# Patient Record
Sex: Male | Born: 2000
Health system: Southern US, Community
[De-identification: ages and names within clinical notes are randomized; demographics above are authoritative.]

## PROBLEM LIST (undated history)

## (undated) DIAGNOSIS — R278 Other lack of coordination: Secondary | ICD-10-CM

## (undated) DIAGNOSIS — F902 Attention-deficit hyperactivity disorder, combined type: Principal | ICD-10-CM

## (undated) HISTORY — DX: Other lack of coordination: R27.8

## (undated) HISTORY — DX: Attention-deficit hyperactivity disorder, combined type: F90.2

---
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
## 2018-03-31 ENCOUNTER — Encounter: Payer: Self-pay | Admitting: Pediatrics

## 2018-03-31 ENCOUNTER — Ambulatory Visit (INDEPENDENT_AMBULATORY_CARE_PROVIDER_SITE_OTHER): Payer: 59 | Admitting: Pediatrics

## 2018-03-31 VITALS — BP 136/84 | HR 82 | Ht 72.0 in | Wt 140.0 lb

## 2018-03-31 DIAGNOSIS — Z719 Counseling, unspecified: Secondary | ICD-10-CM

## 2018-03-31 DIAGNOSIS — Z7189 Other specified counseling: Secondary | ICD-10-CM | POA: Diagnosis not present

## 2018-03-31 DIAGNOSIS — R278 Other lack of coordination: Secondary | ICD-10-CM

## 2018-03-31 DIAGNOSIS — F902 Attention-deficit hyperactivity disorder, combined type: Secondary | ICD-10-CM

## 2018-03-31 DIAGNOSIS — Z79899 Other long term (current) drug therapy: Secondary | ICD-10-CM

## 2018-03-31 MED ORDER — AMPHETAMINE SULFATE 10 MG PO TABS: 10.0000 mg | ORAL_TABLET | ORAL | 0 refills | Status: DC

## 2018-03-31 NOTE — Patient Instructions (Addendum)
DISCUSSION: Patient and family counseled regarding the following coordination of care items:  Continue medication as directed Evekeo 10 mg - two in the morning and one in the afternoon RX for above e-scribed and sent to pharmacy on record  [facility] [place] [place] [address] Phone: [phone] Fax: [phone]  Counseled medication administration, effects, and possible side effects.  ADHD medications discussed to include different medications and pharmacologic properties of each. Recommendation for specific medication to include dose, administration, expected effects, possible side effects and the risk to benefit ratio of medication management.  Advised importance of:  Good sleep hygiene (8- 10 hours per night) Limited screen time (none on school nights, no more than 2 hours on weekends) Regular exercise(outside and active play) Healthy eating (drink water, no sodas/sweet tea, limit portions and no seconds).  Counseling at this visit included the review of old records and/or current chart with the patient and family.   Counseling included the following discussion points presented at every visit to improve understanding and treatment compliance.  Recent health history and today's examination Growth and development with anticipatory guidance provided regarding brain growth, executive function maturation and pubertal development School progress and continued advocay for appropriate accommodations to include maintain Structure, routine, organization, reward, motivation and consequences.  College transition discussed, counseled regarding setting up disability services.

## 2018-03-31 NOTE — Progress Notes (Signed)
[facility]  DEVELOPMENTAL AND PSYCHOLOGICAL CENTER [facility] [address] [place] [address] Dept: [phone] Dept Fax: [phone] Loc: [phone] Loc Fax: [phone]  Medical Follow-up  Patient ID: James BurkeSeth Kotarski, male  DOB: 2001/08/29, 17 y.o.  MRN: [id number]  Date of Evaluation: 03/31/18  PCP: Alena BillsLittle, [name], MD  Accompanied by: Mother and Father Patient Lives with: mother and father  HISTORY/CURRENT STATUS:  Chief Complaint - Polite and cooperative and present for medical follow up for medication management of ADHD, dysgraphia and learning differences. Last follow up Jan 2019 and currently prescribed Evekeo 30 mg (2 in the am and 1 in the Pm).  Senior year, accepted to [employer].    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  No Sleep concerns. No concerns for toileting. Daily stool, no constipation or diarrhea. Void urine no difficulty. No enuresis.   Participate in daily oral hygiene to include brushing and flossing.  Individual Medical History/Review of System Changes? No  Allergies: Amoxicillin  Current Medications:  Evekeo 10 mg 2 in the morning, occasional pm Medication Side Effects: None  Family Medical/Social History Changes?: No  MENTAL HEALTH: Mental Health  Issues:  Denies sadness, loneliness or depression. No self harm or thoughts of self harm or injury. Denies fears, worries and anxieties. Has good peer relations and is not a bully nor is victimized.  Review of Systems  Constitutional: Negative.   HENT: Negative.   Eyes: Negative.   Respiratory: Negative.   Cardiovascular: Negative.   Gastrointestinal: Negative.   Endocrine: Negative.   Genitourinary: Negative.   Musculoskeletal: Negative.   Skin: Negative.   Allergic/Immunologic: Negative.   Neurological: Negative for seizures, speech difficulty and headaches.  Hematological: Negative.   Psychiatric/Behavioral: Negative for behavioral problems, decreased concentration, dysphoric mood and sleep disturbance. The patient is not nervous/anxious and is not hyperactive.   All other systems reviewed and are negative.  PHYSICAL EXAM: Vitals:  Today's Vitals   03/31/18 0802  BP: 136/84  Pulse: 82  Weight: 140 lb (63.5 kg)  Height: 6' (1.829 m)  , 8 %ile (Z= -1.42) based on CDC (Boys, 2-20 Years) BMI-for-age based on BMI available as of 03/31/2018.  Body mass index is 18.99 kg/m.   General Exam: Physical Exam  Constitutional: He is oriented to person, place, and time. Vital signs are normal. He appears well-developed and well-nourished. He is cooperative. No distress.  HENT:  Head: Normocephalic.  Right Ear: Tympanic membrane and ear canal normal.  Left Ear: Tympanic membrane and ear canal normal.  Nose: Nose normal.  Mouth/Throat: Uvula is midline, oropharynx is clear and moist and mucous membranes are normal.  Eyes: Pupils are equal, round, and reactive to light. Conjunctivae, EOM and lids are normal.  Neck: Normal range of motion. Neck supple. No thyromegaly present.  Cardiovascular: Normal rate, regular rhythm and intact distal pulses.  Pulmonary/Chest: Effort normal and breath sounds normal.  Abdominal: Soft. Normal appearance.  Genitourinary:  Genitourinary Comments:  Deferred  Musculoskeletal: Normal range of motion.  Neurological: He is alert and oriented to person, place, and time. He has normal strength and normal reflexes. He displays no tremor. No cranial nerve deficit or sensory deficit. He exhibits normal muscle tone. He displays a negative Romberg sign. He displays no seizure activity. Coordination and gait normal.  Skin: Skin is warm, dry and intact.  Psychiatric: He has a normal mood and affect. His speech is normal and behavior is normal. Judgment and thought content normal. His mood appears not anxious. His affect is not inappropriate. He is not agitated, not aggressive and not hyperactive. Cognition and memory are normal. He does not express impulsivity or inappropriate judgment. He expresses no suicidal ideation. He expresses no suicidal plans. He is attentive.  Vitals reviewed.  Neurological: oriented to place and person  Testing/Developmental Screens: CGI:21/17 Reviewed with patient and parents       DIAGNOSES:    ICD-10-CM   1. ADHD (attention deficit hyperactivity disorder), combined type F90.2   2. Dysgraphia R27.8   3. Medication management Z79.899   4. Patient counseled Z71.9   5. Parenting dynamics counseling Z71.89   6. Counseling and coordination of care Z71.89     RECOMMENDATIONS:  Patient Instructions  DISCUSSION: Patient and family counseled regarding the following coordination of care items:  Continue medication as directed Evekeo 10 mg - two in the morning and one in the afternoon RX for above e-scribed and sent to pharmacy on record  [facility] [place], [place] - [place] [address] [place] [address] Phone: [phone] Fax: [phone]  Counseled medication administration, effects, and possible side effects.  ADHD medications discussed to include different medications and pharmacologic properties of each. Recommendation for specific medication to include dose,  administration, expected effects, possible side effects and the risk to benefit ratio of medication management.  Advised importance of:  Good sleep hygiene (8- 10 hours per night) Limited screen time (none on school nights, no more than 2 hours on weekends) Regular exercise(outside and active play) Healthy eating (drink water, no sodas/sweet tea, limit portions and no seconds).  Counseling at this visit included the review of old records and/or current chart with the patient and family.   Counseling included the following discussion points presented at every visit to improve understanding and treatment compliance.  Recent health history and today's examination Growth and development with anticipatory guidance provided regarding brain growth, executive function maturation and pubertal development School progress and continued advocay for appropriate accommodations to include maintain Structure, routine, organization, reward, motivation and consequences.  College transition discussed, counseled regarding setting up disability services.  Parents verbalized understanding of all topics discussed.   NEXT APPOINTMENT: Return in about 3 months (around 06/30/2018) for Medical Follow up. Medical Decision-making: More than 50% of the appointment was spent counseling and discussing diagnosis and management of symptoms with the patient and family.   Leticia Penna, NP Counseling Time: 40 Total Contact Time: 50

## 2018-07-03 ENCOUNTER — Encounter: Payer: Self-pay | Admitting: Pediatrics

## 2018-07-03 ENCOUNTER — Ambulatory Visit (INDEPENDENT_AMBULATORY_CARE_PROVIDER_SITE_OTHER): Payer: 59 | Admitting: Pediatrics

## 2018-07-03 VITALS — BP 120/69 | HR 64 | Ht 72.0 in | Wt 134.0 lb

## 2018-07-03 DIAGNOSIS — R278 Other lack of coordination: Secondary | ICD-10-CM | POA: Diagnosis not present

## 2018-07-03 DIAGNOSIS — Z719 Counseling, unspecified: Secondary | ICD-10-CM | POA: Diagnosis not present

## 2018-07-03 DIAGNOSIS — Z79899 Other long term (current) drug therapy: Secondary | ICD-10-CM | POA: Diagnosis not present

## 2018-07-03 DIAGNOSIS — Z7189 Other specified counseling: Secondary | ICD-10-CM

## 2018-07-03 DIAGNOSIS — F902 Attention-deficit hyperactivity disorder, combined type: Secondary | ICD-10-CM | POA: Diagnosis not present

## 2018-07-03 MED ORDER — AMPHETAMINE SULFATE 10 MG PO TABS: 10.0000 mg | ORAL_TABLET | ORAL | 0 refills | Status: DC

## 2018-07-03 NOTE — Progress Notes (Signed)
[facility] [address], [place] [place] [address] Dept: [phone] Dept Fax: [phone] Loc: [phone] Loc Fax: [phone]  Medical Follow-up  Patient ID: James Gay, male  DOB: [birth year], 17 y.o.  MRN: [id number]  Date of Evaluation: 07/03/18  PCP: Alena Bills, MD  Accompanied by: Mother and Father Patient Lives with: mother and father  HISTORY/CURRENT STATUS:  Chief Complaint - Polite and cooperative and present for medical follow up for medication management of ADHD, dysgraphia and learning differences. Last follow up April 2019 and currently prescribed Evekeo 10 mg, two in the Am and one in the Pm.  No meds currently, "has been a few weeks".   EDUCATION: School: [employer] - Graduate  [employer] Freshman at [employer] - will live on [address] - random placement, traditional dorm style with two in room and bathroom in hall Orientation completed June, did not meet with DSO Entrepreneur and minor marketing.  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  No Sleep concerns. No concerns for toileting. Daily stool, no constipation or diarrhea. Void urine no difficulty. No enuresis.   Participate in daily oral hygiene to include brushing and flossing.  Individual Medical History/Review of System Changes? No  Allergies: Amoxicillin  Current Medications:  Current Outpatient Medications:  .  Amphetamine Sulfate (EVEKEO) 10 MG TABS, Take 10-30 mg by mouth as directed. Two in the morning daily, 1/2 to one in the  evening as needed. (Patient not taking: Reported on 07/03/2018) Medication Side Effects: None  Family Medical/Social History Changes?: No  MENTAL HEALTH: Mental Health Issues:  Denies sadness, loneliness or depression. No self harm or thoughts of self harm or injury. Denies fears, worries and anxieties. Has good peer relations and is not a bully nor is victimized.  Review of Systems  Constitutional: Negative.   HENT: Negative.   Eyes: Negative.   Respiratory: Negative.   Cardiovascular: Negative.   Gastrointestinal: Negative.   Endocrine: Negative.   Genitourinary: Negative.   Musculoskeletal: Negative.   Skin: Negative.   Allergic/Immunologic: Negative.   Neurological: Negative for seizures, speech difficulty and headaches.  Hematological: Negative.   Psychiatric/Behavioral: Negative for behavioral problems, decreased concentration, dysphoric mood and sleep disturbance. The patient is not nervous/anxious and is not hyperactive.   All other systems reviewed and are negative.  PHYSICAL EXAM: Vitals:  Today's Vitals   07/03/18 0806  BP: 120/69  Pulse: 64  Weight: 134 lb (60.8 kg)  Height: 6' (1.829 m)  , 3 %ile (Z= -1.96) based on CDC (Boys, 2-20 Years) BMI-for-age based on BMI available as of 07/03/2018. Body mass index is 18.17 kg/m.  General Exam: Physical Exam  Constitutional: He is oriented to person, place, and time. Vital signs are normal. He appears well-developed and well-nourished. He is cooperative. No distress.  HENT:  Head: Normocephalic.  Right Ear: Tympanic membrane and ear canal normal.  Left Ear: Tympanic membrane and ear canal normal.  Nose: Nose normal.  Mouth/Throat: Uvula is midline, oropharynx is clear and moist and mucous membranes are normal.  Eyes: Pupils are equal, round, and reactive to light. Conjunctivae, EOM and lids are normal.  Neck:  Normal range of motion. Neck supple. No thyromegaly present.  Cardiovascular: Normal rate, regular rhythm  and intact distal pulses.  Pulmonary/Chest: Effort normal and breath sounds normal.  Abdominal: Soft. Normal appearance.  Genitourinary:  Genitourinary Comments: Deferred  Musculoskeletal: Normal range of motion.  Neurological: He is alert and oriented to person, place, and time. He has normal strength and normal reflexes. He displays no tremor. No cranial nerve deficit or sensory deficit. He exhibits normal muscle tone. He displays a negative Romberg sign. He displays no seizure activity. Coordination and gait normal.  Skin: Skin is warm, dry and intact.  Psychiatric: He has a normal mood and affect. His speech is normal and behavior is normal. Judgment and thought content normal. His mood appears not anxious. His affect is not inappropriate. He is not agitated, not aggressive and not hyperactive. Cognition and memory are normal. He does not express impulsivity or inappropriate judgment. He expresses no suicidal ideation. He expresses no suicidal plans. He is attentive.  Vitals reviewed.   Neurological: oriented to place and person  Testing/Developmental Screens: CGI:5  Reviewed with patient and parents     DIAGNOSES:    ICD-10-CM   1. ADHD (attention deficit hyperactivity disorder), combined type F90.2   2. Dysgraphia R27.8   3. Medication management Z79.899   4. Patient counseled Z71.9   5. Parenting dynamics counseling Z71.89   6. Counseling and coordination of care Z71.89     RECOMMENDATIONS:  Patient Instructions  DISCUSSION: Patient and family counseled regarding the following coordination of care items:  Continue medication as directed Evekeo 10 mg - two every morning, one in the pm  Counseled medication administration, effects, and possible side effects.  ADHD medications discussed to include different medications and pharmacologic properties of each. Recommendation for specific medication to include dose, administration, expected effects, possible side effects and the  risk to benefit ratio of medication management.  Advised importance of:  Good sleep hygiene (8- 10 hours per night) Limited screen time (none on school nights, no more than 2 hours on weekends) Regular exercise(outside and active play) Healthy eating - needs to eat breakfast that is calorie dense (egg snadwich instead of cereal) and approximately 3000 calories per day for age, body size and growth.  Counseling at this visit included the review of old records and/or current chart with the patient and family.   Counseling included the following discussion points presented at every visit to improve understanding and treatment compliance.  Recent health history and today's examination Growth and development with anticipatory guidance provided regarding brain growth, executive function maturation and pubertal development School progress and continued advocay for appropriate accommodations to include maintain Structure, routine, organization, reward, motivation and consequences.  Additionally the patient was counseled to take medication while driving.  Parents verbalized understanding of all topics discussed.   NEXT APPOINTMENT: Return in about 6 months (around 01/03/2019) for Medical Follow up. Medical Decision-making: More than 50% of the appointment was spent counseling and discussing diagnosis and management of symptoms with the patient and family.   Leticia PennaBobi A Sophiya Morello, NP Counseling Time: 40 Total Contact Time: 50

## 2018-07-03 NOTE — Patient Instructions (Addendum)
DISCUSSION: Patient and family counseled regarding the following coordination of care items:  Continue medication as directed Evekeo 10 mg - two every morning, one in the pm  Counseled medication administration, effects, and possible side effects.  ADHD medications discussed to include different medications and pharmacologic properties of each. Recommendation for specific medication to include dose, administration, expected effects, possible side effects and the risk to benefit ratio of medication management.  Advised importance of:  Good sleep hygiene (8- 10 hours per night) Limited screen time (none on school nights, no more than 2 hours on weekends) Regular exercise(outside and active play) Healthy eating - needs to eat breakfast that is calorie dense (egg snadwich instead of cereal) and approximately 3000 calories per day for age, body size and growth.  Counseling at this visit included the review of old records and/or current chart with the patient and family.   Counseling included the following discussion points presented at every visit to improve understanding and treatment compliance.  Recent health history and today's examination Growth and development with anticipatory guidance provided regarding brain growth, executive function maturation and pubertal development School progress and continued advocay for appropriate accommodations to include maintain Structure, routine, organization, reward, motivation and consequences.  Additionally the patient was counseled to take medication while driving.

## 2018-07-30 DIAGNOSIS — E161 Other hypoglycemia: Secondary | ICD-10-CM | POA: Diagnosis not present

## 2018-07-30 DIAGNOSIS — E162 Hypoglycemia, unspecified: Secondary | ICD-10-CM | POA: Diagnosis not present

## 2018-07-30 DIAGNOSIS — I499 Cardiac arrhythmia, unspecified: Secondary | ICD-10-CM | POA: Diagnosis not present

## 2018-09-01 DIAGNOSIS — Z13220 Encounter for screening for lipoid disorders: Secondary | ICD-10-CM | POA: Diagnosis not present

## 2018-09-01 DIAGNOSIS — Z0000 Encounter for general adult medical examination without abnormal findings: Secondary | ICD-10-CM | POA: Diagnosis not present

## 2018-12-05 ENCOUNTER — Ambulatory Visit (INDEPENDENT_AMBULATORY_CARE_PROVIDER_SITE_OTHER): Payer: 59 | Admitting: Pediatrics

## 2018-12-05 ENCOUNTER — Encounter: Payer: Self-pay | Admitting: Pediatrics

## 2018-12-05 VITALS — BP 119/82 | HR 76 | Ht 72.25 in | Wt 132.0 lb

## 2018-12-05 DIAGNOSIS — Z79899 Other long term (current) drug therapy: Secondary | ICD-10-CM

## 2018-12-05 DIAGNOSIS — Z719 Counseling, unspecified: Secondary | ICD-10-CM | POA: Insufficient documentation

## 2018-12-05 DIAGNOSIS — Z7189 Other specified counseling: Secondary | ICD-10-CM | POA: Insufficient documentation

## 2018-12-05 DIAGNOSIS — F902 Attention-deficit hyperactivity disorder, combined type: Secondary | ICD-10-CM

## 2018-12-05 DIAGNOSIS — R278 Other lack of coordination: Secondary | ICD-10-CM

## 2018-12-05 MED ORDER — AMPHETAMINE SULFATE 10 MG PO TABS: 10.0000 mg | ORAL_TABLET | ORAL | 0 refills | Status: DC

## 2018-12-05 NOTE — Patient Instructions (Addendum)
DISCUSSION: Patient and family counseled regarding the following coordination of care items:  Continue medication as directed Evekeo 10 mg 10 mg one or two in am and one in PM RX for above e-scribed and sent to pharmacy on record  [facility] [place] [place] [address] Phone: [phone] Fax: [phone]  Counseled medication administration, effects, and possible side effects.  ADHD medications discussed to include different medications and pharmacologic properties of each. Recommendation for specific medication to include dose, administration, expected effects, possible side effects and the risk to benefit ratio of medication management.  Advised importance of:  Good sleep hygiene (8- 10 hours per night) Limited screen time (none on school nights, no more than 2 hours on weekends) Regular exercise(outside and active play) Healthy eating (drink water, no sodas/sweet tea, limit portions and no seconds).  Counseling at this visit included the review of old records and/or current chart with the patient and family.   Counseling included the following discussion points presented at every visit to improve understanding and treatment compliance.  Recent health history and today's examination Growth and development with anticipatory guidance provided regarding brain growth, executive function maturation and pubertal development School progress and continued advocay for appropriate accommodations to include maintain Structure, routine, organization, reward, motivation and consequences.  Additionally the patient was counseled to take medication while driving.

## 2018-12-05 NOTE — Progress Notes (Signed)
Patient ID: James Gay, male   DOB: Jun 16, 2001, 17 y.o.   MRN: [id number]  Medical Follow-up  Patient ID: James Gay  DOB: [birth year]  MRN: [id number]  DATE:12/05/18 James Gay, [name], MD  Accompanied by: Self Patient Lives with: mother and father   [employer] dorm with roommate James Gay, two beds and bathroom in hallway with common area  HISTORY/CURRENT STATUS: Chief Complaint - Polite and cooperative and present for medical follow up for medication management of ADHD, dysgraphia and learning differences. Last follow up July 2019 and currently prescribed Evekeo 10 mg, as needed, mostly one in the AM for classes.   Reports that he "forgets a lot" and that it does help for focus when he remembers  EDUCATION: School: [employer]  Year/Grade: Fresh ENG 101, STH - business, CST communication, personal devo 15 hours Passed all A/B grades  Hangs with friends, cool with roommate Hispanic culture  Planning on going back for spring  MEDICAL HISTORY: Appetite: WNL  Sleep: Bedtime: variable 2300 usual Awakens: variable - some early 08 and 09 classes Sleep Concerns: Initiation/Maintenance/Other: Asleep easily, sleeps through the night, feels well-rested.  No Sleep concerns. No concerns for toileting. Daily stool, no constipation or diarrhea. Void urine no difficulty. No enuresis.   Participate in daily oral hygiene to include brushing and flossing.  Individual Medical History/Review of System Changes? No  Allergies:  Allergies  Allergen Reactions  . Amoxicillin Hives   Current Medications:  Evekeo 10 mg - reports some occasional forgetting. Medication Side Effects: None  Family Medical/Social History Changes?: No  MENTAL HEALTH: Mental Health Issues:  Denies sadness, loneliness or depression. No self harm or thoughts of self harm or injury. Denies fears, worries and anxieties. Has good peer relations and is not a bully nor is victimized.  ROS: Review of Systems  Constitutional: Negative.     HENT: Negative.   Eyes: Negative.   Respiratory: Negative.   Cardiovascular: Negative.   Gastrointestinal: Negative.   Endocrine: Negative.   Genitourinary: Negative.   Musculoskeletal: Negative.   Skin: Negative.   Allergic/Immunologic: Negative.   Neurological: Negative for seizures, speech difficulty and headaches.  Hematological: Negative.   Psychiatric/Behavioral: Negative for behavioral problems, decreased concentration, dysphoric mood and sleep disturbance. The patient is not nervous/anxious and is not hyperactive.   All other systems reviewed and are negative.   PHYSICAL EXAM: Vitals:   12/05/18 1015  BP: 119/82  Pulse: 76  Weight: 132 lb (59.9 kg)  Height: 6' 0.25" (1.835 m)   Body mass index is 17.78 kg/m.  General Exam: Physical Exam Vitals signs reviewed.  Constitutional:      General: He is not in acute distress.    Appearance: Normal appearance. He is well-developed.  HENT:     Head: Normocephalic.     Right Ear: Tympanic membrane and ear canal normal.     Left Ear: Tympanic membrane and ear canal normal.     Nose: Nose normal.     Mouth/Throat:     Pharynx: Uvula midline.  Eyes:     General: Lids are normal.     Conjunctiva/sclera: Conjunctivae normal.     Pupils: Pupils are equal, round, and reactive to light.  Neck:     Musculoskeletal: Normal range of motion and neck supple.     Thyroid: No thyromegaly.  Cardiovascular:     Rate and Rhythm: Normal rate and regular rhythm.  Pulmonary:     Effort: Pulmonary effort is normal.     Breath sounds: Normal breath  sounds.  Abdominal:     Palpations: Abdomen is soft.  Genitourinary:    Comments: Deferred Musculoskeletal: Normal range of motion.  Skin:    General: Skin is warm and dry.  Neurological:     Mental Status: He is alert and oriented to person, place, and time.     Cranial Nerves: No cranial nerve deficit.     Sensory: No sensory deficit.     Motor: No tremor, abnormal muscle tone or  seizure activity.     Coordination: Coordination normal.     Gait: Gait normal.     Deep Tendon Reflexes: Reflexes are normal and symmetric.  Psychiatric:        Attention and Perception: He is attentive.        Mood and Affect: Mood is not anxious. Affect is not inappropriate.        Speech: Speech normal.        Behavior: Behavior normal. Behavior is not agitated, aggressive or hyperactive. Behavior is cooperative.        Thought Content: Thought content normal. Thought content does not include suicidal ideation. Thought content does not include suicidal plan.        Judgment: Judgment normal. Judgment is not impulsive or inappropriate.    Neurological: oriented to time, place, and person  Testing/Developmental Screens: CGI:19/16 Reviewed with patient       DIAGNOSES:    ICD-10-CM   1. ADHD (attention deficit hyperactivity disorder), combined type F90.2   2. Dysgraphia R27.8   3. Patient counseled Z71.9   4. Parenting dynamics counseling Z71.89   5. Medication management Z79.899   6. Counseling and coordination of care Z71.89     RECOMMENDATIONS:  Patient Instructions  DISCUSSION: Patient and family counseled regarding the following coordination of care items:  Continue medication as directed Evekeo 10 mg 10 mg one or two in am and one in PM RX for above e-scribed and sent to pharmacy on record  [facility] - [place], [place] [place] [address] Phone: [phone] Fax: [phone]  Counseled medication administration, effects, and possible side effects.  ADHD medications discussed to include different medications and pharmacologic properties of each. Recommendation for specific medication to include dose, administration, expected effects, possible side effects and the risk to benefit ratio of medication management.  Advised importance of:  Good sleep hygiene (8- 10 hours per night) Limited screen time  (none on school nights, no more than 2 hours on weekends) Regular exercise(outside and active play) Healthy eating (drink water, no sodas/sweet tea, limit portions and no seconds).  Counseling at this visit included the review of old records and/or current chart with the patient and family.   Counseling included the following discussion points presented at every visit to improve understanding and treatment compliance.  Recent health history and today's examination Growth and development with anticipatory guidance provided regarding brain growth, executive function maturation and pubertal development School progress and continued advocay for appropriate accommodations to include maintain Structure, routine, organization, reward, motivation and consequences.  Additionally the patient was counseled to take medication while driving.     Patient verbalized understanding of all topics discussed.  NEXT APPOINTMENT: Return in about 6 months (around 06/06/2019) for Medical Follow up.  Medical Decision-making: More than 50% of the appointment was spent counseling and discussing diagnosis and management of symptoms with the patient and family.  Counseling Time: 40 minutes Total Contact Time: 50 minutes

## 2019-05-21 ENCOUNTER — Encounter: Payer: Self-pay | Admitting: Pediatrics

## 2019-05-21 ENCOUNTER — Other Ambulatory Visit: Payer: Self-pay

## 2019-05-21 ENCOUNTER — Ambulatory Visit (INDEPENDENT_AMBULATORY_CARE_PROVIDER_SITE_OTHER): Payer: 59 | Admitting: Pediatrics

## 2019-05-21 DIAGNOSIS — Z79899 Other long term (current) drug therapy: Secondary | ICD-10-CM

## 2019-05-21 DIAGNOSIS — F902 Attention-deficit hyperactivity disorder, combined type: Secondary | ICD-10-CM | POA: Diagnosis not present

## 2019-05-21 DIAGNOSIS — R278 Other lack of coordination: Secondary | ICD-10-CM | POA: Diagnosis not present

## 2019-05-21 DIAGNOSIS — Z0282 Encounter for adoption services: Secondary | ICD-10-CM | POA: Insufficient documentation

## 2019-05-21 DIAGNOSIS — Z7189 Other specified counseling: Secondary | ICD-10-CM

## 2019-05-21 DIAGNOSIS — Z719 Counseling, unspecified: Secondary | ICD-10-CM | POA: Diagnosis not present

## 2019-05-21 MED ORDER — AMPHETAMINE SULFATE 10 MG PO TABS: 10.0000 mg | ORAL_TABLET | ORAL | 0 refills | Status: DC

## 2019-05-21 NOTE — Progress Notes (Signed)
[facility] [address], [place]. [address] [place] [address] Dept: [phone] Dept Fax: [phone]  Medication Check by FaceTime due to COVID-19  Patient ID:  James Gay  male DOB: 04-27-2001   18 y.o.   MRN: [id number]   DATE:05/21/19  PCP: Alena Bills, MD  Interviewed: Angela Burke and Mother  Name: James Gay Location: Their Home Provider location: [facility] Office  Virtual Visit via Video Note Connected with James Gay on 05/21/19 at  2:00 PM EDT by video enabled telemedicine application and verified that I am speaking with the correct person using two identifiers.    I discussed the limitations, risks, security and privacy concerns of performing an evaluation and management service by telephone and the availability of in person appointments. I also discussed with the parents that there may be a patient responsible charge related to this service. The parents expressed understanding and agreed to proceed.  HISTORY OF PRESENT ILLNESS/CURRENT STATUS: James Gay is being followed for medication management for ADHD, dysgraphia and learning disability.   Last visit on 12/06/2019  James Gay currently prescribed Evekeo 10 mg  - 30 mg not taking now. Eating well (eating breakfast, lunch and dinner).   Sleeping: bedtime 2430 pm and wakes at 1000  sleeping through the night.   EDUCATION: School: [employer]   Year/Grade:  Freshman Was back for two or three weeks then home.  James Gay is currently out of school for social distancing due to COVID-19.  On-line was harder, especially in subjects that were not strengths, due to not able to do office hours Math D - college algebra, word problems History of Capitalism B - Art intro C- Econ - F ISM - computing - F  Took advantage of academic relief - allows unsat or withdraw no penalty, anything below C- Withdrew college algebra, unsat in [place] and [place]. Preserves GPA, no credit for  the unsats and withdrawals.  Gained 6 credits Will talk with DSO, for changing majors from business and get the math out of the way.  Finished about one month ago (3 weeks) First semester did well, emancipated from parents  Summer session - all on line, may do wants in class work. Planning on lining up employment - [employer] worked 18 months, did stop during school.  Activities/ Exercise: daily, working  Screen time: (phone, tablet, TV, computer): non-essential   MEDICAL HISTORY: Individual Medical History/ Review of Systems: Changes? :No  Family Medical/ Social History: Changes? No   Patient Lives with: mother and father  Current Medications:  Evekeo 10 mg - not taking daily  Medication Side Effects: None  MENTAL HEALTH: Mental Health Issues:    Denies sadness, loneliness or depression. No self harm or thoughts of self harm or injury. Denies fears, worries and anxieties. Has good peer relations and is not a bully nor is victimized.  DIAGNOSES:    ICD-10-CM   1. ADHD (attention deficit hyperactivity disorder), combined type F90.2   2. Dysgraphia R27.8   3. Medication management Z79.899   4. Patient counseled Z71.9   5. Parenting dynamics counseling Z71.89   6. Counseling and coordination of care Z71.89      RECOMMENDATIONS:  Patient Instructions  DISCUSSION: Counseled regarding the following coordination of care items:  Continue medication as directed Evekeo 10 - 30 mg daily, as directed RX for above e-scribed and sent to pharmacy on record  [facility] - [place], [place] - [address]  [address] [place] [address] Phone: [phone] Fax: [phone]   Daily medication for driving  Counseled medication administration, effects, and possible side effects.  ADHD medications discussed to include different medications and pharmacologic properties of each. Recommendation for specific medication to include dose,  administration, expected effects, possible side effects and the risk to benefit ratio of medication management.  Advised importance of:  Good sleep hygiene (8- 10 hours per night) No later than 2400, up by 0900  Limited screen time (none on school nights, no more than 2 hours on weekends)  Regular exercise(outside and active play) Be physical, walk, run, bike, swim  Healthy eating (drink water, no sodas/sweet tea)  Access DSO for major selection and more support for academic difficulties with math, and on-line learning.      Discussed continued need for routine, structure, motivation, reward and positive reinforcement  Encouraged recommended limitations on TV, tablets, phones, video games and computers for non-educational activities.  Encouraged physical activity and outdoor play, maintaining social distancing.  Discussed how to talk to anxious children about coronavirus.   Referred to ADDitudemag.com for resources about engaging children who are at home in home and online study.    NEXT APPOINTMENT:  Return in about 6 months (around 11/20/2019) for Medication Check. Please call the office for a sooner appointment if problems arise.  Medical Decision-making: More than 50% of the appointment was spent counseling and discussing diagnosis and management of symptoms with the patient and family.  I discussed the assessment and treatment plan with the parent. The parent was provided an opportunity to ask questions and all were answered. The parent agreed with the plan and demonstrated an understanding of the instructions.   The parent was advised to call back or seek an in-person evaluation if the symptoms worsen or if the condition fails to improve as anticipated.  I provided 25 minutes of non-face-to-face time during this encounter.   Completed record review for 0 minutes prior to the virtual video visit.   James PennaBobi A Ruvim Risko, NP  Counseling Time: 25 minutes   Total Contact Time: 25  minutes

## 2019-05-21 NOTE — Patient Instructions (Signed)
DISCUSSION: Counseled regarding the following coordination of care items:  Continue medication as directed Evekeo 10 - 30 mg daily, as directed RX for above e-scribed and sent to pharmacy on record  [facility] - [place], [place] - [place] [address] [place] [address] Phone: [phone] Fax: [phone]   Daily medication for driving  Counseled medication administration, effects, and possible side effects.  ADHD medications discussed to include different medications and pharmacologic properties of each. Recommendation for specific medication to include dose, administration, expected effects, possible side effects and the risk to benefit ratio of medication management.  Advised importance of:  Good sleep hygiene (8- 10 hours per night) No later than 2400, up by 0900  Limited screen time (none on school nights, no more than 2 hours on weekends)  Regular exercise(outside and active play) Be physical, walk, run, bike, swim  Healthy eating (drink water, no sodas/sweet tea)  Access DSO for major selection and more support for academic difficulties with math, and on-line learning.

## 2019-11-30 ENCOUNTER — Other Ambulatory Visit: Payer: Self-pay

## 2019-11-30 ENCOUNTER — Encounter: Payer: Self-pay | Admitting: Pediatrics

## 2019-11-30 ENCOUNTER — Ambulatory Visit (INDEPENDENT_AMBULATORY_CARE_PROVIDER_SITE_OTHER): Payer: 59 | Admitting: Pediatrics

## 2019-11-30 DIAGNOSIS — Z79899 Other long term (current) drug therapy: Secondary | ICD-10-CM | POA: Diagnosis not present

## 2019-11-30 DIAGNOSIS — Z789 Other specified health status: Secondary | ICD-10-CM

## 2019-11-30 DIAGNOSIS — Z0282 Encounter for adoption services: Secondary | ICD-10-CM | POA: Diagnosis not present

## 2019-11-30 DIAGNOSIS — Z719 Counseling, unspecified: Secondary | ICD-10-CM

## 2019-11-30 DIAGNOSIS — R278 Other lack of coordination: Secondary | ICD-10-CM

## 2019-11-30 DIAGNOSIS — F902 Attention-deficit hyperactivity disorder, combined type: Secondary | ICD-10-CM

## 2019-11-30 DIAGNOSIS — Z7189 Other specified counseling: Secondary | ICD-10-CM

## 2019-11-30 MED ORDER — AMPHETAMINE SULFATE 10 MG PO TABS: 10.0000 mg | ORAL_TABLET | Freq: Two times a day (BID) | ORAL | 0 refills | Status: AC

## 2019-11-30 NOTE — Patient Instructions (Signed)
DISCUSSION: Counseled regarding the following coordination of care items:  Continue medication as directed Evekeo 10 mg Twice daily RX for above e-scribed and sent to pharmacy on record  [place], [place] [address] Phone: [phone] Fax: [phone]  Counseled medication administration, effects, and possible side effects.  ADHD medications discussed to include different medications and pharmacologic properties of each. Recommendation for specific medication to include dose, administration, expected effects, possible side effects and the risk to benefit ratio of medication management.  Advised importance of:  Good sleep hygiene (8- 10 hours per night)  Limited screen time (none on school nights, no more than 2 hours on weekends)  Regular exercise(outside and active play)  Healthy eating (drink water, no sodas/sweet tea)  Counseling at this visit included the review of old records and/or current chart.   Counseling included the following discussion points presented at every visit to improve understanding and treatment compliance.  Recent health history and today's examination Growth and development with anticipatory guidance provided regarding brain growth, executive function maturation and pre or pubertal development. School progress and continued advocay for appropriate accommodations to include maintain Structure, routine, organization, reward, motivation and consequences.  Additionally the patient was counseled to take medication while driving.

## 2019-11-30 NOTE — Progress Notes (Signed)
[facility] [place]. [address] [place] [address] Dept: [phone] Dept Fax: [phone]  Medication Check by FaceTime due to COVID-19  Patient ID:  James Gay  male DOB: 2001/01/04   18 y.o.   MRN: [id number]   DATE:11/30/19  PCP: Johny Drilling, MD  Interviewed: Isaiah Blakes  Location: His home (lives with parents) Provider location: [facility] office  Virtual Visit via Video Note Connected with Erving Sassano on 11/30/19 at  2:00 PM EST by video enabled telemedicine application and verified that I am speaking with the correct person using two identifiers.    I discussed the limitations, risks, security and privacy concerns of performing an evaluation and management service by telephone and the availability of in person appointments. I also discussed with the parent/patient that there may be a patient responsible charge related to this service. The parent/patient expressed understanding and agreed to proceed.  HISTORY OF PRESENT ILLNESS/CURRENT STATUS: Dimitriy Carreras is being followed for medication management for ADHD, dysgraphia and learning differences.   Last visit on 05/21/2019  [place] currently prescribed Evekeo 10 mg - one in the morning    Behaviors: feels medication is working for the day, no changes.  Eating well (eating breakfast, lunch and dinner).   Sleeping: bedtime 2300 - 2400 pm awake by 0700 - 0900  Sleeping through the night.   EDUCATION: School: [employer]  Year/Grade: Freshman  Currently on break - classes ended 12/7 Took four classes due to major changing World geo - A [place] - B Earth - A Contemporary culture - F (sociology)  Will do Spring semester and will take: spanish  Changed major to communication - may have one more math, but not hard. Was on campus in dorm, but all virtual most classes. Has one roommate in a four person suite.  Not currently employed.  Did try to work with his full  schedule and was at [employer].  Was not able to keep up.  Activities/ Exercise: daily  Was doing longboarding Friends on line  Screen time: (phone, tablet, TV, computer): non-essential, not excessive Has friends on line.  MEDICAL HISTORY: Individual Medical History/ Review of Systems: Changes? :No  Family Medical/ Social History: Changes? No   Patient Lives with: mother and father  No covid issues from roommate Father had surgery for infection, still in boot  Current Medications:  Evekeo 10 mg - taking one in the morning, most days.  Medication Side Effects: None  MENTAL HEALTH: Mental Health Issues:    Denies sadness, loneliness or depression. No self harm or thoughts of self harm or injury. Denies fears, worries and anxieties. Has good peer relations and is not a bully nor is victimized. Coping doing well  DIAGNOSES:    ICD-10-CM   1. ADHD (attention deficit hyperactivity disorder), combined type  F90.2   2. Dysgraphia  R27.8   3. Adopted person  Z02.82   4. Medication management  Z79.899   5. Patient counseled  Z71.9   6. Counseling and coordination of care  Z71.89     RECOMMENDATIONS:  Patient Instructions  DISCUSSION: Counseled regarding the following coordination of care items:  Continue medication as directed Evekeo 10 mg Twice daily RX for above e-scribed and sent to pharmacy on record  [place], [place] [address] Phone: [phone] Fax: [phone]  Counseled medication administration, effects, and possible side effects.  ADHD medications discussed to include different medications and pharmacologic properties of each. Recommendation for specific medication to include dose, administration, expected effects, possible side effects and the risk to benefit ratio of medication management.  Advised importance of:  Good sleep hygiene (8- 10 hours per night)  Limited  screen time (none on school nights, no more than 2 hours on weekends)  Regular exercise(outside and active play)  Healthy eating (drink water, no sodas/sweet tea)  Counseling at this visit included the review of old records and/or current chart.   Counseling included the following discussion points presented at every visit to improve understanding and treatment compliance.  Recent health history and today's examination Growth and development with anticipatory guidance provided regarding brain growth, executive function maturation and pre or pubertal development. School progress and continued advocay for appropriate accommodations to include maintain Structure, routine, organization, reward, motivation and consequences.  Additionally the patient was counseled to take medication while driving.        Discussed continued need for routine, structure, motivation, reward and positive reinforcement  Encouraged recommended limitations on TV, tablets, phones, video games and computers for non-educational activities.  Encouraged physical activity and outdoor play, maintaining social distancing.  Discussed how to talk to anxious children about coronavirus.   Referred to ADDitudemag.com for resources about engaging children who are at home in home and online study.    NEXT APPOINTMENT:  Return in about 6 months (around 05/30/2020) for Medication Check. Please call the office for a sooner appointment if problems arise.  Medical Decision-making: More than 50% of the appointment was spent counseling and discussing diagnosis and management of symptoms with the parent/patient.  I discussed the assessment and treatment plan with the parent. The parent/patient was provided an opportunity to ask questions and all were answered. The parent/patient agreed with the plan and demonstrated an understanding of the instructions.   The parent/patient was advised to call back or seek an in-person evaluation  if the symptoms worsen or if the condition fails to improve as anticipated.  I provided 25 minutes of non-face-to-face time during this encounter.   Completed record review for 0 minutes prior to the virtual video visit.   Leticia Penna, NP  Counseling Time: 25 minutes   Total Contact Time: 25 minutes

## 2020-09-26 ENCOUNTER — Institutional Professional Consult (permissible substitution): Payer: 59 | Admitting: Pediatrics

## 2020-10-30 ENCOUNTER — Emergency Department
Admission: EM | Admit: 2020-10-30 | Discharge: 2020-10-30 | Disposition: A | Discharge status: Home / Self Care | Payer: 59 | Attending: Emergency Medicine | Admitting: Emergency Medicine

## 2020-10-30 ENCOUNTER — Other Ambulatory Visit: Payer: Self-pay

## 2020-10-30 ENCOUNTER — Emergency Department: Payer: 59

## 2020-10-30 DIAGNOSIS — Z87891 Personal history of nicotine dependence: Secondary | ICD-10-CM | POA: Diagnosis not present

## 2020-10-30 DIAGNOSIS — F10920 Alcohol use, unspecified with intoxication, uncomplicated: Secondary | ICD-10-CM

## 2020-10-30 DIAGNOSIS — F10129 Alcohol abuse with intoxication, unspecified: Secondary | ICD-10-CM | POA: Insufficient documentation

## 2020-10-30 DIAGNOSIS — E876 Hypokalemia: Secondary | ICD-10-CM | POA: Insufficient documentation

## 2020-10-30 DIAGNOSIS — R0602 Shortness of breath: Secondary | ICD-10-CM

## 2020-10-30 LAB — BASIC METABOLIC PANEL
Anion gap: 14 (ref 5–15)
BUN: 13 mg/dL (ref 6–20)
CO2: 21 mmol/L — ABNORMAL LOW (ref 22–32)
Calcium: 8.9 mg/dL (ref 8.9–10.3)
Chloride: 102 mmol/L (ref 98–111)
Creatinine, Ser: 0.86 mg/dL (ref 0.61–1.24)
GFR, Estimated: >60 mL/min (ref >60)
Glucose, Bld: 102 mg/dL — ABNORMAL HIGH (ref 70–99)
Potassium: 2.7 mmol/L — CL (ref 3.5–5.1)
Sodium: 137 mmol/L (ref 135–145)

## 2020-10-30 LAB — CBC WITH DIFFERENTIAL/PLATELET
Abs Immature Granulocytes: 0.02 10*3/uL (ref 0.00–0.07)
Basophils Absolute: 0.1 10*3/uL (ref 0.0–0.1)
Basophils Relative: 1 %
Eosinophils Absolute: 0.5 10*3/uL (ref 0.0–0.5)
Eosinophils Relative: 6 %
HCT: 43.2 % (ref 39.0–52.0)
Hemoglobin: 14.8 g/dL (ref 13.0–17.0)
Immature Granulocytes: 0 %
Lymphocytes Relative: 37 %
Lymphs Abs: 3.5 10*3/uL (ref 0.7–4.0)
MCH: 29.5 pg (ref 26.0–34.0)
MCHC: 34.3 g/dL (ref 30.0–36.0)
MCV: 86.2 fL (ref 80.0–100.0)
Monocytes Absolute: 0.7 10*3/uL (ref 0.1–1.0)
Monocytes Relative: 7 %
Neutro Abs: 4.7 10*3/uL (ref 1.7–7.7)
Neutrophils Relative %: 49 %
Platelets: 406 10*3/uL — ABNORMAL HIGH (ref 150–400)
RBC: 5.01 MIL/uL (ref 4.22–5.81)
RDW: 12.4 % (ref 11.5–15.5)
WBC: 9.5 10*3/uL (ref 4.0–10.5)
nRBC: 0 % (ref 0.0–0.2)

## 2020-10-30 LAB — URINE DRUG SCREEN, QUALITATIVE (ARMC ONLY)
Amphetamines, Ur Screen: NOT DETECTED
Barbiturates, Ur Screen: NOT DETECTED
Benzodiazepine, Ur Scrn: NOT DETECTED
Cannabinoid 50 Ng, Ur ~~LOC~~: POSITIVE — AB
Cocaine Metabolite,Ur ~~LOC~~: NOT DETECTED
MDMA (Ecstasy)Ur Screen: NOT DETECTED
Methadone Scn, Ur: NOT DETECTED
Opiate, Ur Screen: NOT DETECTED
Phencyclidine (PCP) Ur S: NOT DETECTED
Tricyclic, Ur Screen: NOT DETECTED

## 2020-10-30 LAB — MAGNESIUM: Magnesium: 2.2 mg/dL (ref 1.7–2.4)

## 2020-10-30 LAB — ETHANOL: Alcohol, Ethyl (B): 118 mg/dL — ABNORMAL HIGH (ref <10)

## 2020-10-30 MED ORDER — POTASSIUM CHLORIDE CRYS ER 20 MEQ PO TBCR
40.0000 meq | EXTENDED_RELEASE_TABLET | Freq: Once | ORAL | Status: AC
  Administered 2020-10-30: 40 meq via ORAL
  Filled 2020-10-30: qty 2

## 2020-10-30 MED ORDER — POTASSIUM CHLORIDE ER 10 MEQ PO TBCR: 10.0000 meq | EXTENDED_RELEASE_TABLET | Freq: Every day | ORAL | 0 refills | Status: AC

## 2020-10-30 MED ORDER — LORAZEPAM 2 MG/ML IJ SOLN
1.0000 mg | Freq: Once | INTRAMUSCULAR | Status: AC
  Administered 2020-10-30: 1 mg via INTRAVENOUS
  Filled 2020-10-30: qty 1

## 2020-10-30 MED ORDER — DROPERIDOL 2.5 MG/ML IJ SOLN: 2.5000 mg | Freq: Once | INTRAMUSCULAR | Status: DC

## 2020-10-30 NOTE — ED Notes (Signed)
[facility] called to pick up pt for safe discharge. Pt A&O x4 with steady gait. Pt provided with discharge instructions and lead to WR to await ride.

## 2020-10-30 NOTE — ED Notes (Signed)
Pt in hallway using an electric cigarette. Pt educated on the tobacco use policy and advised to put device up until pt discharged.

## 2020-10-30 NOTE — ED Triage Notes (Signed)
Pt arrives to ED from [facility] via [facility] with c/c of altered mental status and alcohol intoxication. EMS reports that patient had consumed multiple shots of liquor. Pt vomited multiple times during transport to ED. Upon arrival, pt alert and oriented to self. Pt cussing and argumentative. Pt arrives with 18g in right AC and 500 NS. Dr Larinda Buttery at pt bedside.

## 2020-10-30 NOTE — ED Notes (Signed)
Pts mother called and asked for update. Pt yelled "who fucking called my parents, you guys are assholes". This nurse informed the patient that no one at this hospital had called his parents, they called here. He stated that "under no curcumstances are you to fucking tell them anything". This nurse informed the patients mother that he was here and safe but I was not able to give any further updates. Pts mother stated that she understood and hung up the phone. This nurse has repeatedly asked the patient not to curse.

## 2020-10-30 NOTE — ED Notes (Signed)
Lab called this RN about K of 2.7, RN made Tom, RN aware.

## 2020-10-30 NOTE — ED Notes (Signed)
Pt up and ambulatory with steady gait to throw trash away.

## 2020-10-30 NOTE — ED Notes (Signed)
Pt assisted to bathroom by Selena Batten, NT.

## 2020-10-30 NOTE — ED Provider Notes (Signed)
[facility] Emergency Department Provider Note   ____________________________________________   First MD Initiated Contact with Patient 10/30/20 0014     (approximate)  I have reviewed the triage vital signs and the nursing notes.   HISTORY  Chief Complaint Altered Mental Status and Alcohol Intoxication    HPI James Gay is a 19 y.o. male with no significant past medical history who presents to the ED for alcohol intoxication.  History is limited due to patient's level of intoxication.  EMS reports that his friends at [facility] called 911 due to patient's level of intoxication and decreased responsiveness.  He vomited multiple times with EMS and was given 4 mg of Zofran IV as well as IV fluids.  On arrival, patient admits to drinking a large amount of alcohol, but denies taking anything else.  He complains of some difficulty breathing, denies other complaints.        Past Medical History:  Diagnosis Date  . ADHD (attention deficit hyperactivity disorder), combined type 03/06/2016  . Dysgraphia 03/06/2016    Patient Active Problem List   Diagnosis Date Noted  . Adopted person 05/21/2019  . ADHD (attention deficit hyperactivity disorder), combined type 03/06/2016  . Dysgraphia 03/06/2016    Past Surgical History:  Procedure Laterality Date  . MOUTH SURGERY  June 08, 2015   Wisdom teeth extracted  . MOUTH SURGERY  2005   Tooth extraction under anesthesia at 19 years of age    Prior to Admission medications   Medication Sig Start Date End Date Taking? Authorizing Provider  Amphetamine Sulfate (EVEKEO) 10 MG TABS Take 10 mg by mouth 2 (two) times daily. 11/30/19   Crump, Bobi A, NP  potassium chloride (KLOR-CON) 10 MEQ tablet Take 1 tablet (10 mEq total) by mouth daily for 5 days. 10/30/20 11/04/20  Chesley Noon, MD    Allergies Amoxicillin  Family History  Adopted: Yes  Family history unknown: Yes    Social History Social History    Tobacco Use  . Smoking status: Former Smoker    Types: E-cigarettes  . Smokeless tobacco: Never Used  . Tobacco comment: last vape end of June 2019  Substance Use Topics  . Alcohol use: No    Alcohol/week: 0.0 standard drinks  . Drug use: Not Currently    Types: Marijuana    Comment: summer issues 2018, two months    Review of Systems  Constitutional: No fever/chills.  Positive for intoxication. Eyes: No visual changes. ENT: No sore throat. Cardiovascular: Denies chest pain. Respiratory: Positive for shortness of breath. Gastrointestinal: No abdominal pain.  No nausea, no vomiting.  No diarrhea.  No constipation. Genitourinary: Negative for dysuria. Musculoskeletal: Negative for back pain. Skin: Negative for rash. Neurological: Negative for headaches, focal weakness or numbness.  ____________________________________________   PHYSICAL EXAM:  VITAL SIGNS: ED Triage Vitals  Enc Vitals Group     BP      Pulse      Resp      Temp      Temp src      SpO2      Weight      Height      Head Circumference      Peak Flow      Pain Score      Pain Loc      Pain Edu?      Excl. in GC?     Constitutional: Awake and alert, intoxicated appearing. Eyes: Conjunctivae are normal. Head: Atraumatic. Nose: No  congestion/rhinnorhea. Mouth/Throat: Mucous membranes are moist. Neck: Normal ROM Cardiovascular: Normal rate, regular rhythm. Grossly normal heart sounds. Respiratory: Normal respiratory effort.  No retractions. Lungs CTAB. Gastrointestinal: Soft and nontender. No distention. Genitourinary: deferred Musculoskeletal: No lower extremity tenderness nor edema. Neurologic:  Normal speech and language. No gross focal neurologic deficits are appreciated. Skin:  Skin is warm, dry and intact. No rash noted. Psychiatric: Mood and affect are normal. Speech and behavior are normal.  ____________________________________________   ED ECG REPORT I, Chesley Noon, the  attending physician, personally viewed and interpreted this ECG.   Date: 10/30/2020  EKG Time: 00:41  Rate: 77  Rhythm: normal sinus rhythm  Axis: Normal  Intervals:Prolonged QT  ST&T Change: None   LABS (all labs ordered are listed, but only abnormal results are displayed)  Labs Reviewed  CBC WITH DIFFERENTIAL/PLATELET - Abnormal; Notable for the following components:      Result Value   Platelets 406 (*)    All other components within normal limits  BASIC METABOLIC PANEL - Abnormal; Notable for the following components:   Potassium 2.7 (*)    CO2 21 (*)    Glucose, Bld 102 (*)    All other components within normal limits  ETHANOL - Abnormal; Notable for the following components:   Alcohol, Ethyl (B) 118 (*)    All other components within normal limits  URINE DRUG SCREEN, QUALITATIVE ([facility] ONLY) - Abnormal; Notable for the following components:   Cannabinoid 50 Ng, Ur [place] POSITIVE (*)    All other components within normal limits  MAGNESIUM     PROCEDURES  Procedure(s) performed (including Critical Care):  Procedures   ____________________________________________   INITIAL IMPRESSION / ASSESSMENT AND PLAN / ED COURSE       19 year old male with no significant past medical history presents to the ED after friends called EMS due to concern for alcohol intoxication.  Patient appears heavily intoxicated on arrival and admits to consuming large amount of alcohol, but denies drug use.  He does report some difficulty breathing, although is speaking without difficulty, is not in any obvious respiratory distress, and is maintaining O2 sats on room air.  Given his vomiting, we will check chest x-ray for evidence of aspiration or other acute process.  Chest x-ray reviewed by me, no infiltrate, edema, or effusion noted.  EKG shows no evidence of arrhythmia or ischemia, does show mildly prolonged QT.  Labs remarkable for hypokalemia, which is likely contributing to his prolonged  QT.  He was given oral potassium supplementation, is now more awake and alert and no longer complaining of difficulty breathing.  I doubt significant aspiration at this time.  Patient now clinically sober, tolerating p.o. and ambulating with a steady gait.  He is appropriate for discharge home with his friends providing safe ride.  He was prescribed a short course of potassium supplementation and counseled to have his potassium rechecked at his PCPs office.  Patient agrees with plan.      ____________________________________________   FINAL CLINICAL IMPRESSION(S) / ED DIAGNOSES  Final diagnoses:  Alcoholic intoxication without complication (HCC)  Hypokalemia     ED Discharge Orders         Ordered    potassium chloride (KLOR-CON) 10 MEQ tablet  Daily        10/30/20 1191           Note:  This document was prepared using Dragon voice recognition software and may include unintentional dictation errors.   Chesley Noon, MD  10/30/20 0621  

## 2020-10-30 NOTE — ED Notes (Signed)
Pt provided with meal tray and PO fluids.

## 2021-05-30 IMAGING — CR DG CHEST 1V
1 series · 1 of 1 positions shown · non-contrast
Comparison: None.

CLINICAL DATA: Dyspnea

EXAM:
CHEST  1 VIEW

[chest ap]
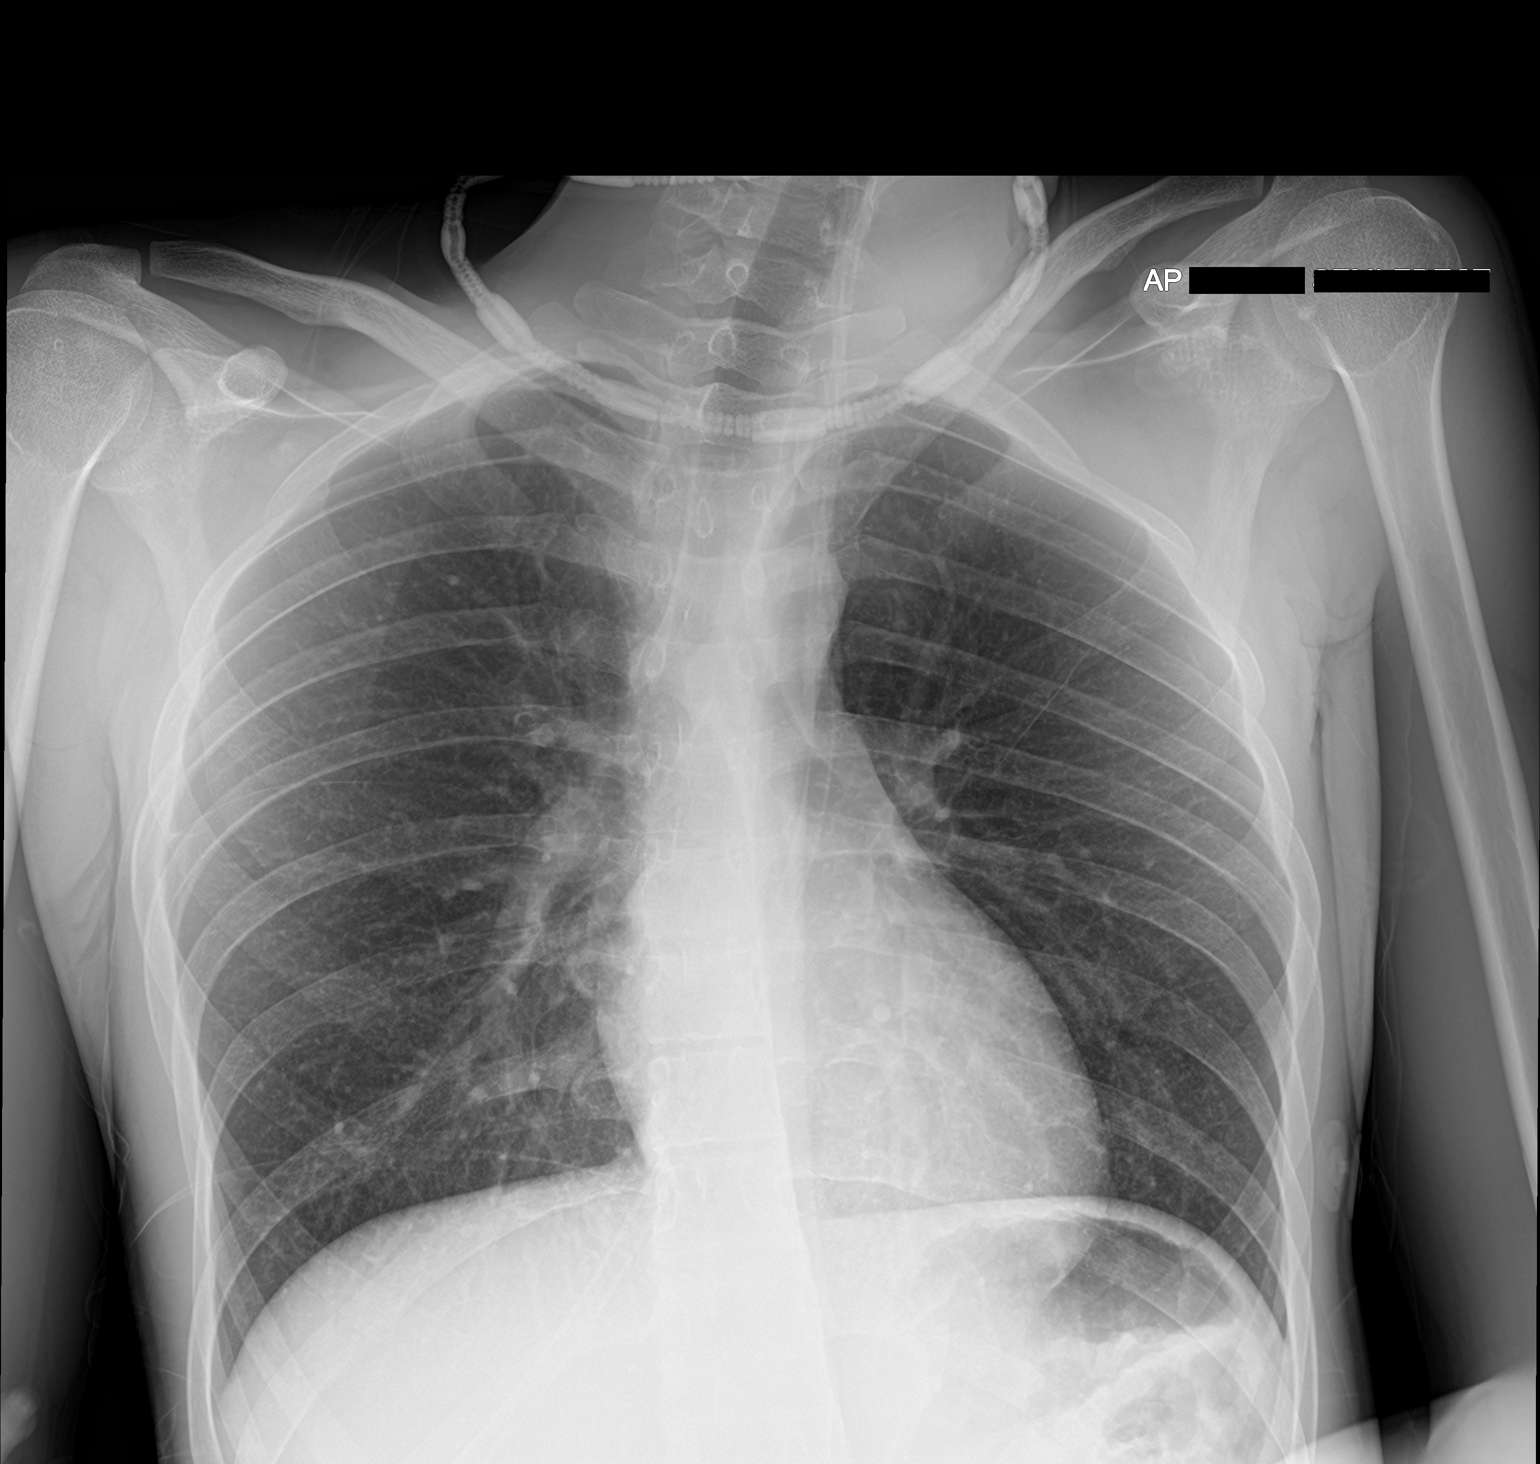

[1 of 1 positions shown; findings below may reference images not displayed]

FINDINGS: The heart size and mediastinal contours are within normal limits.
Both lungs are clear. The visualized skeletal structures are
unremarkable.
IMPRESSION: No active disease.
